# Patient Record
Sex: Female | Born: 1960 | Race: White | Hispanic: No | Smoking: Former smoker
Health system: Southern US, Community
[De-identification: ages and names within clinical notes are randomized; demographics above are authoritative.]

## PROBLEM LIST (undated history)

## (undated) DIAGNOSIS — Z202 Contact with and (suspected) exposure to infections with a predominantly sexual mode of transmission: Secondary | ICD-10-CM

## (undated) DIAGNOSIS — F329 Major depressive disorder, single episode, unspecified: Secondary | ICD-10-CM

## (undated) DIAGNOSIS — F32A Depression, unspecified: Secondary | ICD-10-CM

## (undated) DIAGNOSIS — F419 Anxiety disorder, unspecified: Secondary | ICD-10-CM

## (undated) HISTORY — DX: Major depressive disorder, single episode, unspecified: F32.9

## (undated) HISTORY — PX: TUBAL LIGATION: SHX77

## (undated) HISTORY — PX: APPENDECTOMY: SHX54

## (undated) HISTORY — DX: Anxiety disorder, unspecified: F41.9

## (undated) HISTORY — PX: TONSILLECTOMY: SUR1361

## (undated) HISTORY — DX: Contact with and (suspected) exposure to infections with a predominantly sexual mode of transmission: Z20.2

## (undated) HISTORY — DX: Depression, unspecified: F32.A

---

## 2012-05-27 ENCOUNTER — Ambulatory Visit: Payer: Self-pay | Admitting: Licensed Clinical Social Worker

## 2012-05-27 ENCOUNTER — Ambulatory Visit (INDEPENDENT_AMBULATORY_CARE_PROVIDER_SITE_OTHER): Payer: BC Managed Care – PPO | Admitting: Licensed Clinical Social Worker

## 2012-05-27 DIAGNOSIS — F331 Major depressive disorder, recurrent, moderate: Secondary | ICD-10-CM

## 2012-06-02 DIAGNOSIS — F329 Major depressive disorder, single episode, unspecified: Secondary | ICD-10-CM | POA: Insufficient documentation

## 2012-06-03 ENCOUNTER — Ambulatory Visit (INDEPENDENT_AMBULATORY_CARE_PROVIDER_SITE_OTHER): Payer: BC Managed Care – PPO | Admitting: Licensed Clinical Social Worker

## 2012-06-03 DIAGNOSIS — F331 Major depressive disorder, recurrent, moderate: Secondary | ICD-10-CM

## 2012-06-10 ENCOUNTER — Ambulatory Visit (INDEPENDENT_AMBULATORY_CARE_PROVIDER_SITE_OTHER): Payer: BC Managed Care – PPO | Admitting: Licensed Clinical Social Worker

## 2012-06-10 DIAGNOSIS — F331 Major depressive disorder, recurrent, moderate: Secondary | ICD-10-CM

## 2012-06-29 ENCOUNTER — Ambulatory Visit (INDEPENDENT_AMBULATORY_CARE_PROVIDER_SITE_OTHER): Payer: BC Managed Care – PPO | Admitting: Licensed Clinical Social Worker

## 2012-06-29 DIAGNOSIS — F331 Major depressive disorder, recurrent, moderate: Secondary | ICD-10-CM

## 2012-08-19 ENCOUNTER — Ambulatory Visit (INDEPENDENT_AMBULATORY_CARE_PROVIDER_SITE_OTHER): Payer: BC Managed Care – PPO | Admitting: Licensed Clinical Social Worker

## 2012-08-19 DIAGNOSIS — F331 Major depressive disorder, recurrent, moderate: Secondary | ICD-10-CM

## 2012-08-31 ENCOUNTER — Ambulatory Visit (INDEPENDENT_AMBULATORY_CARE_PROVIDER_SITE_OTHER): Payer: BC Managed Care – PPO | Admitting: Licensed Clinical Social Worker

## 2012-08-31 DIAGNOSIS — F331 Major depressive disorder, recurrent, moderate: Secondary | ICD-10-CM

## 2012-09-30 ENCOUNTER — Ambulatory Visit: Payer: BC Managed Care – PPO | Admitting: Licensed Clinical Social Worker

## 2012-10-07 ENCOUNTER — Ambulatory Visit (INDEPENDENT_AMBULATORY_CARE_PROVIDER_SITE_OTHER): Payer: BC Managed Care – PPO | Admitting: Licensed Clinical Social Worker

## 2012-10-07 DIAGNOSIS — F331 Major depressive disorder, recurrent, moderate: Secondary | ICD-10-CM

## 2012-12-21 ENCOUNTER — Ambulatory Visit: Payer: BC Managed Care – PPO | Admitting: Certified Nurse Midwife

## 2013-01-11 ENCOUNTER — Encounter: Payer: Self-pay | Admitting: Certified Nurse Midwife

## 2013-01-12 ENCOUNTER — Institutional Professional Consult (permissible substitution): Payer: Self-pay | Admitting: Certified Nurse Midwife

## 2013-01-19 ENCOUNTER — Ambulatory Visit (INDEPENDENT_AMBULATORY_CARE_PROVIDER_SITE_OTHER): Payer: BC Managed Care – PPO | Admitting: Certified Nurse Midwife

## 2013-01-19 ENCOUNTER — Encounter: Payer: Self-pay | Admitting: Certified Nurse Midwife

## 2013-01-19 VITALS — BP 120/70 | HR 68 | Resp 16 | Ht 61.25 in | Wt 137.0 lb

## 2013-01-19 DIAGNOSIS — F411 Generalized anxiety disorder: Secondary | ICD-10-CM

## 2013-01-19 MED ORDER — ESCITALOPRAM OXALATE 10 MG PO TABS: 10.0000 mg | ORAL_TABLET | Freq: Every day | ORAL | Status: DC

## 2013-01-19 NOTE — Progress Notes (Signed)
52 y.o. Divorced Caucasian female [phone] here for follow up of anxiety treated with Lexapro 10 mg initiated on 06/03/12.Taking medication in am, and working well.  Denies any crying episodes, insomnia, panic attacks, or thoughts of self harm or others.  Daughter recently married and doing well.  Mom and daughter talk now.  Patients mother now moved here from [place] for health issues. Patient denies any problems with assisting mother. 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 " a great change for me" sees counselor prn now. Periods regular until last 2 months, no menses, but had period the end of June. Has menses calendar to record and is aware of changes with perimenopausal. No health issues today!   O: Healthy WD,WN female Affect: Normal, smiling, well dressed    A:Anxiety/Depression stable on Lexapro 2-Episodic social stress with appropriate emotional response 3-Perimenopausal with menses cycle change   P: Continue medication daily in am. Will re-evaluate at aex in 11/14. Rx Lexapro see order Congratulated patient on the progress she has made 2-Continue to seek family union and support 3-Continue menses calendar with normal and abnormal parameters, and advise if change   RV prn, aex  35 minutes spent with patient with >50% of time spent in face to face counseling.

## 2013-01-19 NOTE — Progress Notes (Signed)
Reviewed CPRomine, MD 

## 2013-02-01 ENCOUNTER — Ambulatory Visit: Payer: Self-pay | Admitting: Certified Nurse Midwife

## 2013-05-31 ENCOUNTER — Ambulatory Visit: Payer: BC Managed Care – PPO | Admitting: Certified Nurse Midwife

## 2013-06-21 ENCOUNTER — Encounter: Payer: Self-pay | Admitting: Certified Nurse Midwife

## 2013-06-21 ENCOUNTER — Ambulatory Visit (INDEPENDENT_AMBULATORY_CARE_PROVIDER_SITE_OTHER): Payer: BC Managed Care – PPO | Admitting: Certified Nurse Midwife

## 2013-06-21 VITALS — BP 120/70 | HR 72 | Resp 16 | Ht 60.5 in | Wt 146.0 lb

## 2013-06-21 DIAGNOSIS — Z01419 Encounter for gynecological examination (general) (routine) without abnormal findings: Secondary | ICD-10-CM

## 2013-06-21 DIAGNOSIS — F341 Dysthymic disorder: Secondary | ICD-10-CM

## 2013-06-21 DIAGNOSIS — F411 Generalized anxiety disorder: Secondary | ICD-10-CM

## 2013-06-21 DIAGNOSIS — N951 Menopausal and female climacteric states: Secondary | ICD-10-CM

## 2013-06-21 DIAGNOSIS — Z0000 Encounter for general adult medical examination without abnormal findings: Secondary | ICD-10-CM

## 2013-06-21 DIAGNOSIS — F329 Major depressive disorder, single episode, unspecified: Secondary | ICD-10-CM

## 2013-06-21 LAB — FOLLICLE STIMULATING HORMONE: FSH: 52.9 m[IU]/mL

## 2013-06-21 MED ORDER — ESCITALOPRAM OXALATE 10 MG PO TABS: 10.0000 mg | ORAL_TABLET | Freq: Every day | ORAL | Status: DC

## 2013-06-21 NOTE — Patient Instructions (Signed)

## 2013-06-21 NOTE — Progress Notes (Signed)
52 y.o. [id number] Divorced Caucasian Fe here for annual exam.  Perimenopausal with amenorrhea, last period 02/05/13.  No bleeding or spotting until 11/14 which lasted 3 days, red blood. Occasional hot flash or night sweats. Denies vaginal dryness. Anxiety/Depression so much better with Lexapro, desires continuance. Sees urgent care if needed.  Patient's last menstrual period was 02/05/2013. Spotting for 3 days 11/14        Sexually active: no  The current method of family planning is tubal ligation.    Exercising: no  but lifts and walks alot with job Smoker:  No  Quit smoking in 2010 (cigarettes)  Health Maintenance: Pap:  05/24/12 neg MMG: 2007  Pt declines having another mammogram, financial reason, offered free information declines Colonoscopy:  Declines ever due to know history, IFOB dispensed BMD:   2007  Osteoporosis  Pt declines having another BMD no xray exposure desired.,  TDaP:  Less than 10 years Labs:  Pt. Declines  Hgb and U/A   reports that she quit smoking about 4 years ago. Her smoking use included Cigarettes. She smoked 0.00 packs per day. She has never used smokeless tobacco. She reports that she does not drink alcohol or use illicit drugs.  Past Medical History  Diagnosis Date  . Anxiety   . Depression   . Possible exposure to STD     sexually active with +HIV partner yrs ago, testing to 2004 neg    Past Surgical History  Procedure Laterality Date  . Tubal ligation    . Appendectomy    . Tonsillectomy      Current Outpatient Prescriptions  Medication Sig Dispense Refill  . escitalopram (LEXAPRO) 10 MG tablet Take 1 tablet (10 mg total) by mouth daily.  30 tablet  5   No current facility-administered medications for this visit.    Family History  Problem Relation Age of Onset  . Hypertension Father   . Hepatitis C Father   . Stroke Maternal Grandmother   . COPD Mother   . Osteoporosis Mother     ROS:  Pertinent items are noted in HPI.  Otherwise, a  comprehensive ROS was negative.  Exam:   BP 120/70  Pulse 72  Resp 16  Ht 5' 0.5" (1.537 m)  Wt 146 lb (66.225 kg)  BMI 28.03 kg/m2  LMP 02/05/2013 Height: 5' 0.5" (153.7 cm)  Ht Readings from Last 3 Encounters:  06/21/13 5' 0.5" (1.537 m)  01/19/13 5' 1.25" (1.556 m)    General appearance: alert, cooperative and appears stated age Head: Normocephalic, without obvious abnormality, atraumatic Neck: no adenopathy, supple, symmetrical, trachea midline and thyroid normal to inspection and palpation Lungs: clear to auscultation bilaterally Breasts: normal appearance, no masses or tenderness, No nipple retraction or dimpling, No nipple discharge or bleeding, No axillary or supraclavicular adenopathy Heart: regular rate and rhythm Abdomen: soft, non-tender; no masses,  no organomegaly Extremities: extremities normal, atraumatic, no cyanosis or edema Skin: Skin color, texture, turgor normal. No rashes or lesions Lymph nodes: Cervical, supraclavicular, and axillary nodes normal. No abnormal inguinal nodes palpated Neurologic: Grossly normal   Pelvic: External genitalia:  no lesions              Urethra:  normal appearing urethra with no masses, tenderness or lesions              Bartholin's and Skene's: normal                 Vagina: normal appearing vagina with normal color  and discharge, no lesions              Cervix: normal, non tender              Pap taken: no Bimanual Exam:  Uterus:  normal size, contour, position, consistency, mobility, non-tender and anteverted              Adnexa: normal adnexa and no mass, fullness, tenderness               Rectovaginal: Confirms               Anus:  normal sphincter tone, no lesions  A:  Well Woman with normal exam  Perimenopausal with irregular cycles  Anxiety/Depression doing well on Lexapro  P:   Reviewed health and wellness pertinent to exam  Discussed etiology of perimenopause and expectations and warning signs with bleeding  profile. Has menses calendar. Instructed to notify if no bleeding in 3 months. Lab [facility]  Discussed continuation of Lexapro and counseling as needed, patient agrees to this plan, because it works.  Rx Lexapro see order  Pap smear as per guidelines   Mammogram yearly recommended, discussed free program available, patient declines. Discussed early detection advantage with mammogram, patient declined ever. pap smear not taken today counseled on breast self exam, mammography screening, menopause, adequate intake of calcium and vitamin D, diet and exercise, and colonoscopy recommended at 52, patient declined scheduling, IFOB dispensed.  return annually or prn  An After Visit Summary was printed and given to the patient.

## 2013-06-22 ENCOUNTER — Telehealth: Payer: Self-pay

## 2013-06-22 MED ORDER — MEDROXYPROGESTERONE ACETATE 10 MG PO TABS: 10.0000 mg | ORAL_TABLET | Freq: Every day | ORAL | Status: DC

## 2013-06-22 NOTE — Telephone Encounter (Signed)
Patient notified of results.

## 2013-06-22 NOTE — Telephone Encounter (Signed)
Returning a call to Joy. °

## 2013-06-22 NOTE — Telephone Encounter (Signed)
lmtcb

## 2013-06-22 NOTE — Telephone Encounter (Signed)
Message copied by Eliezer Bottom on Wed Jun 22, 2013  1:56 PM ------      Message from: Verner Chol      Created: Wed Jun 22, 2013 12:10 PM       Notify patient that [facility] shows menopausal range, but may or may not have any more bleeding.      Needs Rx Provera due to no period since 6/14. Order in. Instruct patient to call if no bleeding after 2 weeks after completing Provera or if she has bleeding, so we can assess status at that point. ------

## 2013-06-24 NOTE — Progress Notes (Signed)
Note reviewed, agree with plan.  Vikram Tillett, MD  

## 2013-08-27 ENCOUNTER — Encounter (HOSPITAL_COMMUNITY): Payer: Self-pay | Admitting: Emergency Medicine

## 2013-08-27 ENCOUNTER — Observation Stay (HOSPITAL_COMMUNITY)
Admission: EM | Admit: 2013-08-27 | Discharge: 2013-08-28 | Disposition: A | Discharge status: Home / Self Care | Payer: BC Managed Care – PPO | Attending: Family Medicine | Admitting: Family Medicine

## 2013-08-27 ENCOUNTER — Emergency Department (HOSPITAL_COMMUNITY): Payer: BC Managed Care – PPO

## 2013-08-27 ENCOUNTER — Emergency Department (HOSPITAL_COMMUNITY)
Admission: EM | Admit: 2013-08-27 | Discharge: 2013-08-27 | Disposition: A | Discharge status: Hospice | Payer: BC Managed Care – PPO | Source: Home / Self Care | Attending: Family Medicine | Admitting: Family Medicine

## 2013-08-27 DIAGNOSIS — F341 Dysthymic disorder: Secondary | ICD-10-CM

## 2013-08-27 DIAGNOSIS — R079 Chest pain, unspecified: Secondary | ICD-10-CM

## 2013-08-27 DIAGNOSIS — R05 Cough: Secondary | ICD-10-CM | POA: Insufficient documentation

## 2013-08-27 DIAGNOSIS — R110 Nausea: Secondary | ICD-10-CM | POA: Insufficient documentation

## 2013-08-27 DIAGNOSIS — R6889 Other general symptoms and signs: Secondary | ICD-10-CM | POA: Insufficient documentation

## 2013-08-27 DIAGNOSIS — Z880 Allergy status to penicillin: Secondary | ICD-10-CM | POA: Insufficient documentation

## 2013-08-27 DIAGNOSIS — J029 Acute pharyngitis, unspecified: Secondary | ICD-10-CM | POA: Insufficient documentation

## 2013-08-27 DIAGNOSIS — F3289 Other specified depressive episodes: Secondary | ICD-10-CM | POA: Insufficient documentation

## 2013-08-27 DIAGNOSIS — R059 Cough, unspecified: Secondary | ICD-10-CM

## 2013-08-27 DIAGNOSIS — Z79899 Other long term (current) drug therapy: Secondary | ICD-10-CM | POA: Insufficient documentation

## 2013-08-27 DIAGNOSIS — R0789 Other chest pain: Principal | ICD-10-CM | POA: Insufficient documentation

## 2013-08-27 DIAGNOSIS — F419 Anxiety disorder, unspecified: Secondary | ICD-10-CM

## 2013-08-27 DIAGNOSIS — F329 Major depressive disorder, single episode, unspecified: Secondary | ICD-10-CM

## 2013-08-27 DIAGNOSIS — Z91040 Latex allergy status: Secondary | ICD-10-CM | POA: Insufficient documentation

## 2013-08-27 DIAGNOSIS — Z87891 Personal history of nicotine dependence: Secondary | ICD-10-CM | POA: Insufficient documentation

## 2013-08-27 DIAGNOSIS — F411 Generalized anxiety disorder: Secondary | ICD-10-CM | POA: Insufficient documentation

## 2013-08-27 DIAGNOSIS — R51 Headache: Secondary | ICD-10-CM | POA: Insufficient documentation

## 2013-08-27 LAB — BASIC METABOLIC PANEL
BUN: 14 mg/dL (ref 6–23)
CO2: 25 mEq/L (ref 19–32)
CREATININE: 0.76 mg/dL (ref 0.50–1.10)
Calcium: 8.3 mg/dL — ABNORMAL LOW (ref 8.4–10.5)
Chloride: 105 mEq/L (ref 96–112)
GFR calc Af Amer: >90 mL/min (ref >90)
Glucose, Bld: 90 mg/dL (ref 70–99)
POTASSIUM: 4.3 meq/L (ref 3.7–5.3)
Sodium: 142 mEq/L (ref 137–147)

## 2013-08-27 LAB — TROPONIN I

## 2013-08-27 LAB — POCT I-STAT TROPONIN I: Troponin i, poc: 0 ng/mL (ref 0.00–0.08)

## 2013-08-27 LAB — CBC
HEMATOCRIT: 42 % (ref 36.0–46.0)
Hemoglobin: 14.6 g/dL (ref 12.0–15.0)
MCH: 31.1 pg (ref 26.0–34.0)
MCHC: 34.8 g/dL (ref 30.0–36.0)
MCV: 89.6 fL (ref 78.0–100.0)
Platelets: 207 10*3/uL (ref 150–400)
RBC: 4.69 MIL/uL (ref 3.87–5.11)
RDW: 12.2 % (ref 11.5–15.5)
WBC: 4 10*3/uL (ref 4.0–10.5)

## 2013-08-27 IMAGING — CR DG CHEST 1V PORT
1 series · 1 of 1 positions shown · non-contrast
Comparison: None.

CLINICAL DATA: 52-year-old female with chest pain and cough.

EXAM:
PORTABLE CHEST - 1 VIEW

[AP]
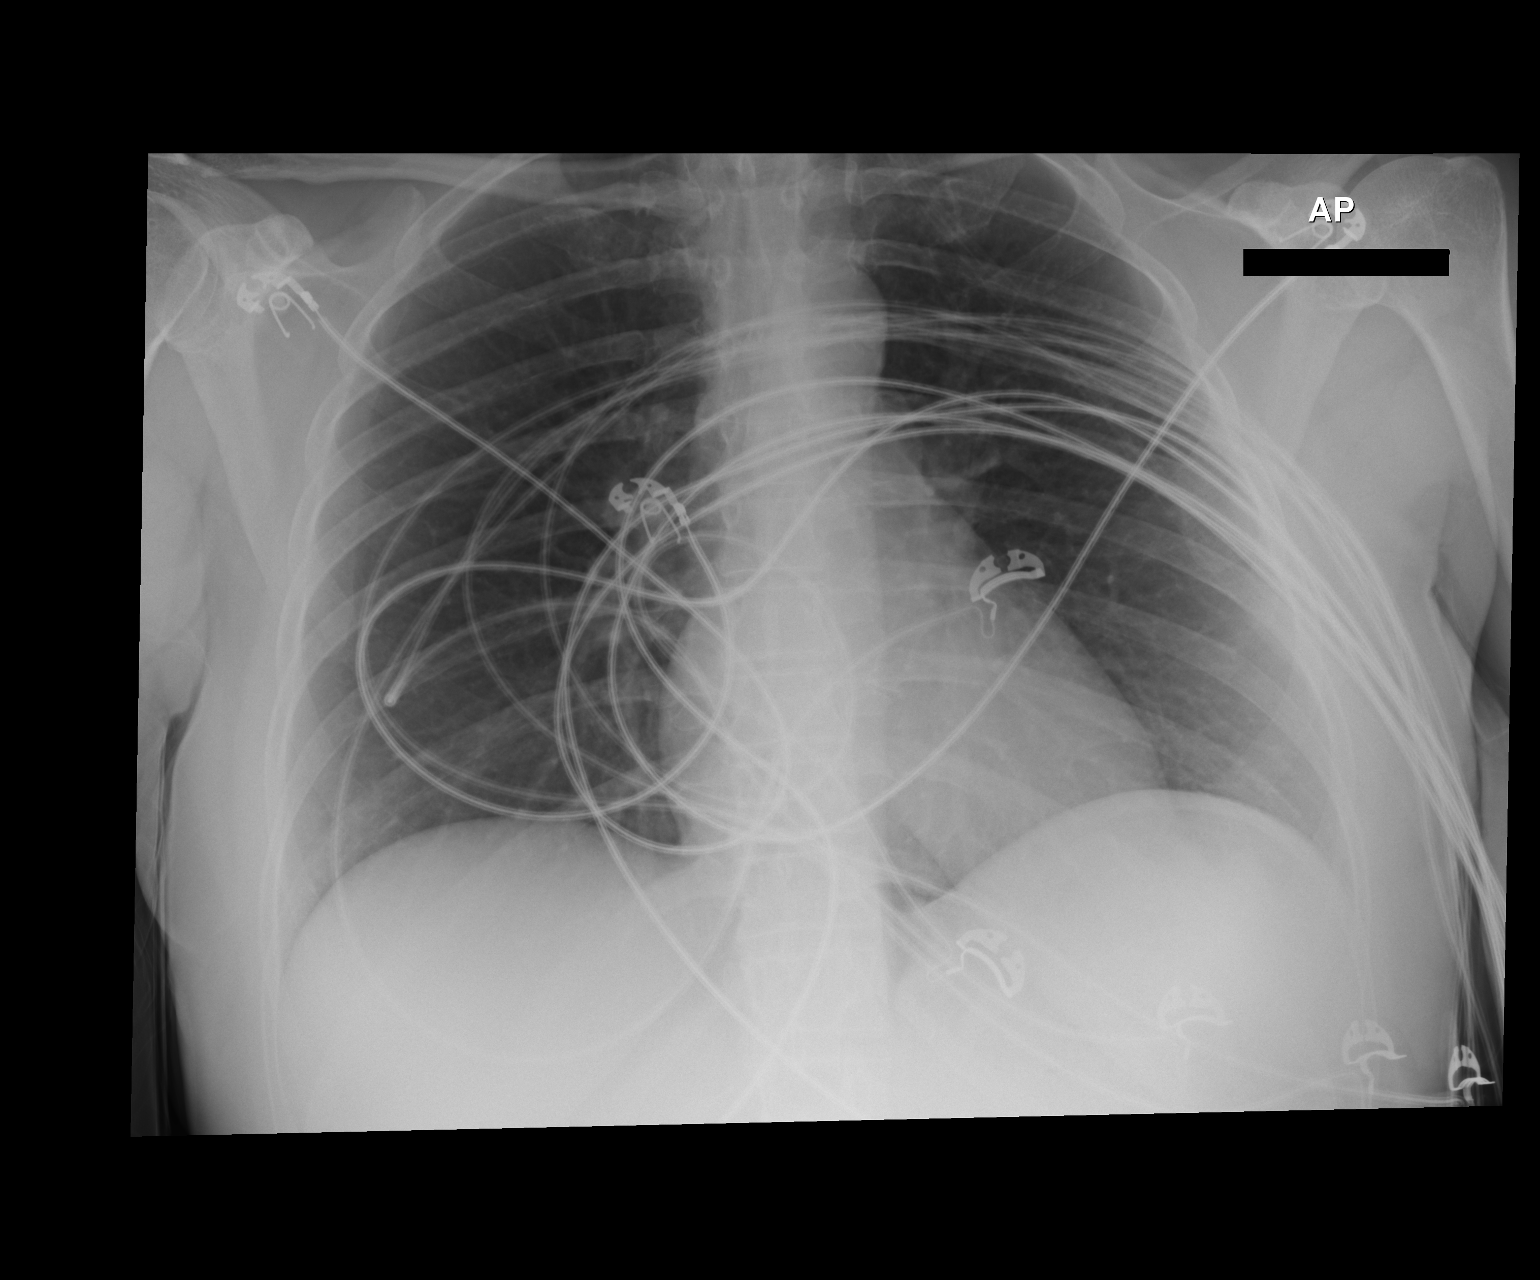

[1 of 1 positions shown; findings below may reference images not displayed]

FINDINGS: The cardiomediastinal silhouette is unremarkable.

The lungs are clear.

There is no evidence of focal airspace disease, pulmonary edema,
suspicious pulmonary nodule/mass, pleural effusion, or pneumothorax.
No acute bony abnormalities are identified.
IMPRESSION: No active disease.

## 2013-08-27 MED ORDER — MORPHINE SULFATE 4 MG/ML IJ SOLN
4.0000 mg | Freq: Once | INTRAMUSCULAR | Status: AC
  Administered 2013-08-27: 4 mg via INTRAVENOUS
  Filled 2013-08-27: qty 1

## 2013-08-27 MED ORDER — ASPIRIN 81 MG PO CHEW
324.0000 mg | CHEWABLE_TABLET | Freq: Once | ORAL | Status: AC
  Administered 2013-08-27: 324 mg via ORAL

## 2013-08-27 MED ORDER — SODIUM CHLORIDE 0.9 % IV SOLN
Freq: Once | INTRAVENOUS | Status: AC
  Administered 2013-08-27: 17:00:00 via INTRAVENOUS

## 2013-08-27 MED ORDER — ASPIRIN EC 81 MG PO TBEC
81.0000 mg | DELAYED_RELEASE_TABLET | Freq: Every day | ORAL | Status: DC
  Filled 2013-08-27: qty 1

## 2013-08-27 MED ORDER — ALPRAZOLAM 0.25 MG PO TABS: 0.2500 mg | ORAL_TABLET | Freq: Two times a day (BID) | ORAL | Status: DC | PRN

## 2013-08-27 MED ORDER — NITROGLYCERIN 0.4 MG SL SUBL
0.4000 mg | SUBLINGUAL_TABLET | SUBLINGUAL | Status: DC | PRN
  Administered 2013-08-27: 0.4 mg via SUBLINGUAL

## 2013-08-27 MED ORDER — ONDANSETRON HCL 4 MG/2ML IJ SOLN: 4.0000 mg | Freq: Four times a day (QID) | INTRAMUSCULAR | Status: DC | PRN

## 2013-08-27 MED ORDER — ASPIRIN 81 MG PO CHEW
CHEWABLE_TABLET | ORAL | Status: AC
  Filled 2013-08-27: qty 4

## 2013-08-27 MED ORDER — ACETAMINOPHEN 325 MG PO TABS
650.0000 mg | ORAL_TABLET | ORAL | Status: DC | PRN
Start: 2013-08-27 — End: 2013-08-28

## 2013-08-27 MED ORDER — ESCITALOPRAM OXALATE 10 MG PO TABS
10.0000 mg | ORAL_TABLET | Freq: Every day | ORAL | Status: DC
  Administered 2013-08-27: 10 mg via ORAL
  Filled 2013-08-27 (×2): qty 1

## 2013-08-27 MED ORDER — NITROGLYCERIN 0.4 MG SL SUBL: 0.4000 mg | SUBLINGUAL_TABLET | SUBLINGUAL | Status: DC | PRN

## 2013-08-27 MED ORDER — HEPARIN SODIUM (PORCINE) 5000 UNIT/ML IJ SOLN
5000.0000 [IU] | Freq: Three times a day (TID) | INTRAMUSCULAR | Status: DC
  Administered 2013-08-27 – 2013-08-28 (×2): 5000 [IU] via SUBCUTANEOUS
  Filled 2013-08-27 (×5): qty 1

## 2013-08-27 MED ORDER — MORPHINE SULFATE 2 MG/ML IJ SOLN: 2.0000 mg | INTRAMUSCULAR | Status: DC | PRN

## 2013-08-27 MED ORDER — NITROGLYCERIN 0.4 MG SL SUBL
SUBLINGUAL_TABLET | SUBLINGUAL | Status: AC
  Filled 2013-08-27: qty 25

## 2013-08-27 MED ORDER — SODIUM CHLORIDE 0.9 % IV SOLN
INTRAVENOUS | Status: AC
  Administered 2013-08-27: 22:00:00 via INTRAVENOUS

## 2013-08-27 NOTE — ED Notes (Signed)
Pain has eased.

## 2013-08-27 NOTE — ED Notes (Signed)
C/O COLD SX STATES SHE HAS A DRY COUGH WHICH IS MAKING HER THROAT SORE AND SNEEZING MOTRIN AND VICKS VAPOR RUB WAS USED BUT NO RELIEF.

## 2013-08-27 NOTE — ED Notes (Signed)
States is having severe abd pain in right upper quad and epigastric area-- pain on palpation--states it started after receiving Morphine. Pain into back.

## 2013-08-27 NOTE — ED Notes (Signed)
Pt able to ambulate to the restroom

## 2013-08-27 NOTE — ED Provider Notes (Signed)
CSN: [id number]     Arrival date & time 08/27/13  1455 History   First MD Initiated Contact with Patient 08/27/13 1624     Chief Complaint  Patient presents with  . URI   (Consider location/radiation/quality/duration/timing/severity/associated sxs/prior Treatment) HPI Comments: 53 year old female with no significant past medical history presents for evaluation of chest pain and side pain. 3 days ago, she began to experience left upper chest and left lateral rib cage pain that radiated to her left arm. This was associated with nausea. The pain has been waxing and waning in severity but it is still present at this time. The nausea has since resolved. Yesterday, she began to get a persistent dry cough that has been associated with a sore throat.  Patient is a 53 y.o. female presenting with URI.  URI Presenting symptoms: cough and sore throat   Presenting symptoms: no fever   Associated symptoms: sneezing   Associated symptoms: no arthralgias and no myalgias     Past Medical History  Diagnosis Date  . Anxiety   . Depression   . 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 Tubal ligation    . Appendectomy    . Tonsillectomy     Family History  Problem Relation Age of Onset  . Hypertension Father   . Hepatitis C Father   . Stroke Maternal Grandmother   . COPD Mother   . Osteoporosis Mother    History  Substance Use Topics  . Smoking status: Former Smoker    Types: Cigarettes    Quit date: 06/21/2009  . Smokeless tobacco: Never Used  . Alcohol Use: No   OB History   Grav Para Term Preterm Abortions TAB SAB Ect Mult Living   4 3 3  1 1    3      Review of Systems  Constitutional: Negative for fever and chills.  HENT: Positive for sneezing and sore throat.   Eyes: Negative for visual disturbance.  Respiratory: Positive for cough. Negative for shortness of breath.    Cardiovascular: Positive for chest pain. Negative for palpitations and leg swelling.  Gastrointestinal: Positive for nausea. Negative for vomiting and abdominal pain.  Endocrine: Negative for polydipsia and polyuria.  Genitourinary: Negative for dysuria, urgency and frequency.  Musculoskeletal: Negative for arthralgias and myalgias.  Skin: Negative for rash.  Neurological: Negative for dizziness, weakness and light-headedness.    Allergies  Penicillins and Latex  Home Medications   Current Outpatient Rx  Name  Route  Sig  Dispense  Refill  . escitalopram (LEXAPRO) 10 MG tablet   Oral   Take 1 tablet (10 mg total) by mouth daily.   30 tablet   12   . medroxyPROGESTERone (PROVERA) 10 MG tablet   Oral   Take 1 tablet (10 mg total) by mouth daily.   10 tablet   0    BP 134/87  Pulse 90  Temp(Src) 98.6 F (37 C) (Oral)  Resp 19  SpO2 100% Physical Exam  Nursing note and vitals reviewed. Constitutional: She is oriented to person, place, and time. Vital signs are normal. She appears well-developed and well-nourished. No distress.  HENT:  Head: Normocephalic and atraumatic.  Cardiovascular: Normal rate, regular rhythm and normal heart sounds.  Exam reveals no gallop and no friction rub.   No murmur heard. Pulmonary/Chest: Effort normal and breath sounds normal. No  respiratory distress. She has no wheezes. She has no rales. She exhibits no tenderness.  Neurological: She is alert and oriented to person, place, and time. She has normal strength. Coordination normal.  Skin: Skin is warm and dry. No rash noted. She is not diaphoretic.  Psychiatric: She has a normal mood and affect. Judgment normal.    ED Course  Procedures (including critical care time) Labs Review Labs Reviewed - No data to display Imaging Review No results found.  EKG ?right axis deviation.  Otherwise normal   MDM   1. Chest pain   2. Cough    This patient is having chest pain that has been  radiating to her left arm, associated with nausea. She believes this is caused by muscular pain, but her chest is completely nontender. She also thinks it has been caused by coughing, although she was having the chest pain for 2 days before the coughing started.  She is being tranferred to ED to r/o ACS, PE.      Graylon GoodZachary H Yisel Megill, PA-C 08/27/13 1656

## 2013-08-27 NOTE — ED Provider Notes (Signed)
Medical screening examination/treatment/procedure(s) were conducted as a shared visit with non-physician practitioner(s) and myself.  I personally evaluated the patient during the encounter.   CP in pt with cardiac risk factors. Plan to admit to medicine for further evaluation. No issues during ER stay.  Darlys Galesavid Masneri, MD 08/27/13 [phone]

## 2013-08-27 NOTE — ED Provider Notes (Signed)
Medical screening examination/treatment/procedure(s) were performed by a resident physician or non-physician practitioner and as the supervising physician I was immediately available for consultation/collaboration.  Hiawatha Merriott, MD    Kileen Lange S Moises Terpstra, MD 08/27/13 2026 

## 2013-08-27 NOTE — H&P (Signed)
Patient Demographics  Rebekah Hale, is a 53 y.o. female  MRN: [id number]   DOB - 11-03-1960  Admit Date - 08/27/2013  Outpatient Primary MD for the patient is No PCP Per Patient   With History of -  Past Medical History  Diagnosis Date  . Anxiety   . Depression   . Possible exposure to STD     sexually active with +HIV partner yrs ago, testing to 2004 neg      Past Surgical History  Procedure Laterality Date  . Tubal ligation    . Appendectomy    . Tonsillectomy      in for   Chief Complaint  Patient presents with  . Chest Pain  . Nausea  . Cough     HPI  Rebekah Hale  is a 53 y.o. female, without significant cardiac history, presents with complaints of chest pain for last 3 days, described it as intermittent, provoked by exercise, resolved by sublingual nitroglycerin, accompanied by some nausea, denies any dizziness, diaphoresis, palpitation, patient went to urgent care today for same complaints, was given 324 mg of by mouth aspirin, and sublingual nitroglycerin, reports her chest pain has resolved after receiving sublingual nitroglycerin , patient troponin was negative, her EKG had no acute ST changes.    Review of Systems    In addition to the HPI above,  No Fever-chills, No Headache, No changes with Vision or hearing, No problems swallowing food or Liquids, Complaints of Chest pain, and Cough denies any Shortness of Breath, No Abdominal pain, No Nausea or Vommitting, Bowel movements are regular, No Blood in stool or Urine, No dysuria, No new skin rashes or bruises, No new joints pains-aches,  No new weakness, tingling, numbness in any extremity, No recent weight gain or loss, No polyuria, polydypsia or polyphagia, No significant Mental Stressors.  A full 10 point Review of Systems  was done, except as stated above, all other Review of Systems were negative.   Social History History  Substance Use Topics  . Smoking status: Former Smoker    Types: Cigarettes    Quit date: 06/21/2009  . Smokeless tobacco: Never Used  . Alcohol Use: No     Family History Family History  Problem Relation Age of Onset  . Hypertension Father   . Hepatitis C Father   . Stroke Maternal Grandmother   . COPD Mother   . Osteoporosis Mother     Prior to Admission medications   Medication Sig Start Date End Date Taking? Authorizing Provider  escitalopram (LEXAPRO) 10 MG tablet Take 1 tablet (10 mg total) by mouth daily. 06/21/13  Yes Verner Chol, CNM    Allergies  Allergen Reactions  . Penicillins Shortness Of Breath  . Latex Rash    Physical Exam  Vitals  Blood pressure 129/66, pulse 77, temperature 98.3 F (36.8 C), temperature source Oral, resp. rate 18, height 5\' 1"  (1.549 m), weight 66.225 kg (146 lb), SpO2  99.00%.   1. General well-nourished female lying in bed in NAD,    2. Normal affect and insight, Not Suicidal or Homicidal, Awake Alert, Oriented X 3.  3. No F.N deficits, ALL C.Nerves Intact, Strength 5/5 all 4 extremities, Sensation intact all 4 extremities, Plantars down going.  4. Ears and Eyes appear Normal, Conjunctivae clear, PERRLA. Moist Oral Mucosa.  5. Supple Neck, No JVD, No cervical lymphadenopathy appriciated, No Carotid Bruits.  6. Symmetrical Chest wall movement, Good air movement bilaterally, CTAB. No reproducible chest pain on palpation.  7. RRR, No Gallops, Rubs or Murmurs, No Parasternal Heave.  8. Positive Bowel Sounds, Abdomen Soft, Non tender, No organomegaly appriciated,No rebound -guarding or rigidity.  9.  No Cyanosis, Normal Skin Turgor, No Skin Rash or Bruise.  10. Good muscle tone,  joints appear normal , no effusions, Normal ROM.  11. No Palpable Lymph Nodes in Neck or Axillae    Data Review   CBC  Recent  Labs Lab 08/27/13 1748  WBC 4.0  HGB 14.6  HCT 42.0  PLT 207  MCV 89.6  MCH 31.1  MCHC 34.8  RDW 12.2   ------------------------------------------------------------------------------------------------------------------  Chemistries   Recent Labs Lab 08/27/13 1748  NA 142  K 4.3  CL 105  CO2 25  GLUCOSE 90  BUN 14  CREATININE 0.76  CALCIUM 8.3*   ------------------------------------------------------------------------------------------------------------------ estimated creatinine clearance is 73.3 ml/min (by C-G formula based on Cr of 0.76). ------------------------------------------------------------------------------------------------------------------ No results found for this basename: TSH, T4TOTAL, FREET3, T3FREE, THYROIDAB,  in the last 72 hours   Coagulation profile No results found for this basename: INR, PROTIME,  in the last 168 hours ------------------------------------------------------------------------------------------------------------------- No results found for this basename: DDIMER,  in the last 72 hours -------------------------------------------------------------------------------------------------------------------  Cardiac Enzymes  Recent Labs Lab 08/27/13 1848  TROPONINI <0.30   ------------------------------------------------------------------------------------------------------------------ No components found with this basename: POCBNP,    ---------------------------------------------------------------------------------------------------------------  Urinalysis No results found for this basename: colorurine, appearanceur, labspec, phurine, glucoseu, hgbur, bilirubinur, ketonesur, proteinur, urobilinogen, nitrite, leukocytesur    ----------------------------------------------------------------------------------------------------------------  Imaging results:   Dg Chest Port 1 View  08/27/2013   CLINICAL DATA:  53 year old female  with chest pain and cough.  EXAM: PORTABLE CHEST - 1 VIEW  COMPARISON:  None.  FINDINGS: The cardiomediastinal silhouette is unremarkable.  The lungs are clear.  There is no evidence of focal airspace disease, pulmonary edema, suspicious pulmonary nodule/mass, pleural effusion, or pneumothorax. No acute bony abnormalities are identified.  IMPRESSION: No active disease.   Electronically Signed   By: Laveda Abbe M.D.   On: 08/27/2013 18:07    My personal review of EKG: Rhythm NSR, Rate  70 /min, no Acute ST changes    Assessment & Plan  Active Problems:   Chest pain   Anxiety and depression    1. chest pain: Patient will be admitted to telemetry unit, already received the 324 mg oral aspirin, will continue to cycle her cardiac enzymes, and if they are negative, she will have me review stress test done in the morning, stress test was approved by Dr. Zachery Conch. Meanwhile we'll continue her on baby aspirin daily.  2. Anxiety and depression: Continue with Lexapro   DVT Prophylaxis Heparin  AM Labs Ordered, also please review Full Orders  Family Communication: Admission, patients condition and plan of care including tests being ordered have been discussed with the patient  who indicate understanding and agree with the plan and Code Status.  Code Status Full  Likely DC to  home  Condition GUARDED    Time spent in minutes : 45 min    Skylie Hiott M.D on 08/27/2013 at 8:08 PM   And look for the night coverage person covering me after hours  [facility] Office  [phone]

## 2013-08-27 NOTE — ED Notes (Signed)
Onset 3 days ago chest pain and nausea with dry cough 2 days ago.  Seen at Urgent Care today sent to ED for further evaluation.  Prior to arrival given 324 mg aspirin and one nitro SL. Pain 6/10 sharp improved to 0/10 with medication and currently 4/10 sharp. Denies shortness of breath.

## 2013-08-27 NOTE — ED Provider Notes (Signed)
CSN: [id number]     Arrival date & time 08/27/13  1733 History   First MD Initiated Contact with Patient 08/27/13 1749     Chief Complaint  Patient presents with  . Chest Pain  . Nausea  . Cough   (Consider location/radiation/quality/duration/timing/severity/associated sxs/prior Treatment) HPI Comments: Patient is a 53 year old female past medical history significant for anxiety, depression, former tobacco user presenting to the emergency department for 3 days of left-sided chest pain. Patient describes it as waxing and waning sharp shooting pain without radiation. Patient states prior to the onset of pain she was doing some heavy lifting at work, states this felt similar to other muscle strains and did not think much of it. Patient states a day later she developed a nonproductive cough, sore throat, sneezing, nausea. Patient denies any alleviating or aggravating factors prior to urgent care visit. States she received 324 mg of aspirin and 1 someway wall nitroglycerin which alleviated her chest pain, but is back up to 4/10. Patient states she did develop a generalized headache and currently still house dust. She denies any shortness of breath. Patient denies any cardiac history. No previous stress test, echocardiogram, catheterizations. No immediate familial cardiac history.   Past Medical History  Diagnosis Date  . Anxiety   . Depression   . 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 Tubal ligation    . Appendectomy    . Tonsillectomy     Family History  Problem Relation Age of Onset  . Hypertension Father   . Hepatitis C Father   . Stroke Maternal Grandmother   . COPD Mother   . Osteoporosis Mother    History  Substance Use Topics  . Smoking status: Former Smoker    Types: Cigarettes    Quit date: 06/21/2009  . Smokeless tobacco: Never Used  . Alcohol Use: No   OB History    Grav Para Term Preterm Abortions TAB SAB Ect Mult Living   4 3 3  1 1    3      Review of Systems  Constitutional: Negative for fever and chills.  HENT: Positive for sneezing and sore throat.   Respiratory: Positive for cough. Negative for shortness of breath.   Cardiovascular: Positive for chest pain. Negative for palpitations and leg swelling.  Gastrointestinal: Positive for nausea. Negative for vomiting, abdominal pain and diarrhea.  Neurological: Positive for headaches.  All other systems reviewed and are negative.    Allergies  Penicillins and Latex  Home Medications   Current Outpatient Rx  Name  Route  Sig  Dispense  Refill  . escitalopram (LEXAPRO) 10 MG tablet   Oral   Take 1 tablet (10 mg total) by mouth daily.   30 tablet   12    BP 129/66  Pulse 77  Temp(Src) 98.3 F (36.8 C) (Oral)  Resp 18  Ht 5\' 1"  (1.549 m)  Wt 146 lb (66.225 kg)  BMI 27.60 kg/m2  SpO2 99% Physical Exam  Constitutional: She is oriented to person, place, and time. She appears well-developed and well-nourished. No distress.  HENT:  Head: Normocephalic and atraumatic.  Right Ear: External ear normal.  Left Ear: External ear normal.  Nose: Nose normal.  Mouth/Throat: Oropharynx is clear and moist. No oropharyngeal exudate.  Eyes: Conjunctivae are normal.  Neck: Neck supple.  Cardiovascular: Normal rate, regular rhythm and normal  heart sounds.   Pulmonary/Chest: Effort normal and breath sounds normal. No respiratory distress. She has no wheezes. She has no rales. She exhibits no tenderness.  Abdominal: Soft. Bowel sounds are normal. There is no tenderness.  Musculoskeletal: Normal range of motion. She exhibits no edema.  Lymphadenopathy:    She has no cervical adenopathy.  Neurological: She is alert and oriented to person, place, and time.  Skin: Skin is warm and dry. She is not diaphoretic.    ED Course  Procedures (including critical care time) Medications  morphine 4 MG/ML  injection 4 mg (4 mg Intravenous Given 08/27/13 1852)    Labs Review Labs Reviewed  BASIC METABOLIC PANEL - Abnormal; Notable for the following:    Calcium 8.3 (*)    All other components within normal limits  CBC  TROPONIN I  POCT I-STAT TROPONIN I   Imaging Review Dg Chest Port 1 View  08/27/2013   CLINICAL DATA:  53 year old female with chest pain and cough.  EXAM: PORTABLE CHEST - 1 VIEW  COMPARISON:  None.  FINDINGS: The cardiomediastinal silhouette is unremarkable.  The lungs are clear.  There is no evidence of focal airspace disease, pulmonary edema, suspicious pulmonary nodule/mass, pleural effusion, or pneumothorax. No acute bony abnormalities are identified.  IMPRESSION: No active disease.   Electronically Signed   By: Laveda Abbe M.D.   On: 08/27/2013 18:07    EKG Interpretation    Date/Time:  Saturday August 27 2013 17:36:09 EST Ventricular Rate:  70 PR Interval:  133 QRS Duration: 84 QT Interval:  408 QTC Calculation: 440 R Axis:   84 Text Interpretation:  Sinus rhythm Baseline wander in lead(s) V1 Confirmed by MASNERI  MD, DAVID ([id number]) on 08/27/2013 6:06:51 PM            MDM   1. Chest pain     Filed Vitals:   08/27/13 1830  BP: 129/66  Pulse: 77  Temp:   Resp: 18    Afebrile, NAD, non-toxic appearing, AAOx4.   Concern for cardiac etiology of Chest Pain. Hospitalist has been consulted and will see patient in the ED for likely admit. Pt does not meet criteria for CP protocol and a further evaluation is recommended. Pt has been re-evaluated prior to consult and VSS, NAD, heart RRR, pain 0/10, lungs CTAB. No acute abnormalities found on EKG and first round of cardiac enzymes negative. This case was discussed with Dr. Redgie Grayer who has seen the patient and agrees with plan to admit.      Jeannetta Ellis, PA-C 08/27/13 2031

## 2013-08-28 ENCOUNTER — Observation Stay (HOSPITAL_COMMUNITY): Payer: BC Managed Care – PPO

## 2013-08-28 DIAGNOSIS — R079 Chest pain, unspecified: Secondary | ICD-10-CM

## 2013-08-28 LAB — TROPONIN I: Troponin I: <0.3 ng/mL (ref <0.30)

## 2013-08-28 MED ORDER — TECHNETIUM TC 99M SESTAMIBI - CARDIOLITE: 30.0000 | Freq: Once | INTRAVENOUS | Status: DC | PRN

## 2013-08-28 MED ORDER — TECHNETIUM TC 99M SESTAMIBI GENERIC - CARDIOLITE
10.0000 | Freq: Once | INTRAVENOUS | Status: AC | PRN
  Administered 2013-08-28: 10 via INTRAVENOUS

## 2013-08-28 NOTE — Progress Notes (Signed)
GXT Myoview done- pt exercised 9 minutes of the Bruce Protocol. Max HR 147, > 85% APMHR. no ST changes. Some lt lateral chest pain, atypical for angina. Images pending.  Corine ShelterLUKE Keamber Macfadden PA-C 08/28/2013 10:15 AM

## 2013-08-28 NOTE — H&P (Signed)
Physician Discharge Summary  Rebekah Hale EAV:[id number] DOB: 1961-06-18 DOA: 08/27/2013  PCP: No PCP Per Patient  Admit date: 08/27/2013 Discharge date: 08/28/2013  Time spent: > 35 minutes  Recommendations for Outpatient Follow-up:  1. These be sure to followup with physician should any new concerns arise.  Discharge Diagnoses:  Active Problems:   Anxiety and depression   Chest pain   Discharge Condition: Stable  Diet recommendation: Heart healthy  Filed Weights   08/27/13 1739 08/27/13 2044  Weight: 66.225 kg (146 lb) 65.817 kg (145 lb 1.6 oz)    History of present illness/Hospital Course: 53 year old Caucasian female with history of depression and anxiety who presented to the ED secondary to chest discomfort and upper respiratory infection.  Chest pain - And context 53 year old with upper respiratory infection and cough. - Cardiology on board initially and patient underwent workup. Troponins have been negative and Myoview stress test negative - Atypical most likely related to recent URI/cough  Upper respiratory infection - Most likely secondary to viral etiology. -Recommend supportive therapy with good hand hygiene  Procedures:  Myoview stress test  Consultations:  Cardiology: Dr. Eden Emms  Discharge Exam: Filed Vitals:   08/28/13 1400  BP: 147/70  Pulse: 71  Temp: 98.7 F (37.1 C)  Resp: 18    General: Pt in NAd, alert and awake Cardiovascular: RRR, no MRG Respiratory: CTA BL, no wheezes or rhales  Discharge Instructions  Discharge Orders   Future Appointments Provider Department Dept Phone   12/21/2013 11:15 AM Verner Chol, CNM [facility] [phone]   Future Orders Complete By Expires   Call MD for:  difficulty breathing, headache or visual disturbances  As directed    Call MD for:  severe uncontrolled pain  As directed    Diet - low sodium heart healthy  As directed    Discharge instructions  As directed    Comments:      Follow up with Primary Care Provider within the next two weeks.   Increase activity slowly  As directed        Medication List         escitalopram 10 MG tablet  Commonly known as:  LEXAPRO  Take 1 tablet (10 mg total) by mouth daily.       Allergies  Allergen Reactions  . Penicillins Shortness Of Breath  . Latex Rash      The results of significant diagnostics from this hospitalization (including imaging, microbiology, ancillary and laboratory) are listed below for reference.    Significant Diagnostic Studies: Nm Myocar Multi W/spect W/wall Motion / Ef  08/28/2013   CLINICAL DATA:  Chest pain.  EXAM: MYOCARDIAL IMAGING WITH SPECT (REST AND EXERCISE)  GATED LEFT VENTRICULAR WALL MOTION STUDY  LEFT VENTRICULAR EJECTION FRACTION  TECHNIQUE: Standard myocardial SPECT imaging was performed after resting intravenous injection of 10 mCi Tc-47m sestamibi. Subsequently, exercise tolerance test was performed by the patient under the supervision of the Cardiology staff. At peak-stress, 30 mCi Tc-66m sestamibi was injected intravenously and standard myocardial SPECT imaging was performed. Quantitative gated imaging was also performed to evaluate left ventricular wall motion, and estimate left ventricular ejection fraction.  COMPARISON:  Plain film 08/27/2013.  FINDINGS: Rest images demonstrate normal left ventricular cavity size. No fixed defect. Stress images demonstrate an area of subtle reversibility involving the apical segment of the anterior wall.  Evaluation of wall motion demonstrates normal left ventricular wall motion and thickening.  Ejection fraction is estimated at 77%. End diastolic volume  of 50 cc. End systolic volume of 12 cc.  IMPRESSION: 1. Subtle area of reversibility involving the apical segment of the anterior wall. Cannot exclude inducible ischemia. 2. Normal left ventricular wall motion and thickening. 3. Normal ejection fraction estimated at 77%.   Electronically Signed   By:  Jeronimo GreavesKyle  Talbot M.D.   On: 08/28/2013 12:15   Dg Chest Port 1 View  08/27/2013   CLINICAL DATA:  53 year old female with chest pain and cough.  EXAM: PORTABLE CHEST - 1 VIEW  COMPARISON:  None.  FINDINGS: The cardiomediastinal silhouette is unremarkable.  The lungs are clear.  There is no evidence of focal airspace disease, pulmonary edema, suspicious pulmonary nodule/mass, pleural effusion, or pneumothorax. No acute bony abnormalities are identified.  IMPRESSION: No active disease.   Electronically Signed   By: Laveda AbbeJeff  Hu M.D.   On: 08/27/2013 18:07    Microbiology: No results found for this or any previous visit (from the past 240 hour(s)).   Labs: Basic Metabolic Panel:  Recent Labs Lab 08/27/13 1748  NA 142  K 4.3  CL 105  CO2 25  GLUCOSE 90  BUN 14  CREATININE 0.76  CALCIUM 8.3*   Liver Function Tests: No results found for this basename: AST, ALT, ALKPHOS, BILITOT, PROT, ALBUMIN,  in the last 168 hours No results found for this basename: LIPASE, AMYLASE,  in the last 168 hours No results found for this basename: AMMONIA,  in the last 168 hours CBC:  Recent Labs Lab 08/27/13 1748  WBC 4.0  HGB 14.6  HCT 42.0  MCV 89.6  PLT 207   Cardiac Enzymes:  Recent Labs Lab 08/27/13 1848 08/28/13 0050  TROPONINI <0.30 <0.30   BNP: BNP (last 3 results) No results found for this basename: PROBNP,  in the last 8760 hours CBG: No results found for this basename: GLUCAP,  in the last 168 hours     Signed:  Penny PiaVEGA, Rebekah Hale  [facility] 08/28/2013, 3:19 PM

## 2013-08-28 NOTE — Progress Notes (Signed)
Patient ID: Bosie HelperDonna Stancil, female   DOB: [birth year], 53 y.o.   MRN: [id number] Reviewed patients stress myovue ECG normal no ishcemia Read by radiologist as possible small area apical ischemia Looks normal to me EF normal   Ok to d/c  I will send note to my nurse to f/u with me in a few weeks to see how she is doing   Charlton HawsPeter Cardarius Senat

## 2013-08-28 NOTE — Progress Notes (Signed)
Utilization Review Completed.Rebekah Hale T2/02/2014  

## 2013-08-29 NOTE — Progress Notes (Signed)
lmtcb ./cy 

## 2013-08-29 NOTE — Discharge Summary (Signed)
Rebekah Piarlando [name], MD Physician Signed Family Medicine H&P Service date: 08/28/2013 3:19 PM  Physician Discharge Summary    Rebekah Hale UJW:[id number] DOB: [birth year] DOA: 08/27/2013   PCP: No PCP Per Patient   Admit date: 08/27/2013 Discharge date: 08/28/2013   Time spent: > 35 minutes   Recommendations for Outpatient Follow-up:   These be sure to followup with physician should any new concerns arise.   Discharge Diagnoses:   Active Problems:   Anxiety and depression   Chest pain   Discharge Condition: Stable   Diet recommendation: Heart healthy    Filed Weights     08/27/13 1739  08/27/13 2044   Weight:  66.225 kg (146 lb)  65.817 kg (145 lb 1.6 oz)      History of present illness/Hospital Course: 53 year old Caucasian female with history of depression and anxiety who presented to the ED secondary to chest discomfort and upper respiratory infection.   Chest pain - And context 53 year old with upper respiratory infection and cough. - Cardiology on board initially and patient underwent workup. Troponins have been negative and Myoview stress test negative - Atypical most likely related to recent URI/cough   Upper respiratory infection - Most likely secondary to viral etiology. -Recommend supportive therapy with good hand hygiene   Procedures: Myoview stress test   Consultations: Cardiology: Dr. Eden EmmsNishan   Discharge Exam: Filed Vitals:     08/28/13 1400   BP:  147/70   Pulse:  71   Temp:  98.7 F (37.1 C)   Resp:  18      General: Pt in NAd, alert and awake Cardiovascular: RRR, no MRG Respiratory: CTA BL, no wheezes or rhales   Discharge Instructions    Discharge Orders     Future Appointments  Provider  Department  Dept Phone     12/21/2013 11:15 AM  Verner [facility]oleborah S Leonard[facility] CN[facility]  [phone]     Future Orders  Complete By  Expires     Call MD for:  difficulty breathing, headache or visual disturbances   As directed        Call MD for:  severe uncontrolled pain   As directed       Diet - low sodium heart healthy   As directed       Discharge instructions   As directed       Comments:       Follow up with Primary Care Provider within the next two weeks.     Increase activity slowly   As directed            Medication List              escitalopram 10 MG tablet   Commonly known as:  LEXAPRO   Take 1 tablet (10 mg total) by mouth daily.           Allergies   Allergen  Reactions   .  Penicillins  Shortness Of Breath   .  Latex  Rash       --------------------------------------------------------------------------------   The results of significant diagnostics from this hospitalization (including imaging, microbiology, ancillary and laboratory) are listed below for reference.       Significant Diagnostic Studies: Nm Myocar Multi W/spect W/wall Motion / Ef   08/28/2013   CLINICAL DATA:  Chest pain.  EXAM: MYOCARDIAL IMAGING WITH SPECT (REST AND EXERCISE)  GATED LEFT VENTRICULAR WALL MOTION STUDY  LEFT VENTRICULAR EJECTION FRACTION  TECHNIQUE: Standard myocardial SPECT imaging  was performed after resting intravenous injection of 10 mCi Tc-51m sestamibi. Subsequently, exercise tolerance test was performed by the patient under the supervision of the Cardiology staff. At peak-stress, 30 mCi Tc-86m sestamibi was injected intravenously and standard myocardial SPECT imaging was performed. Quantitative gated imaging was also performed to evaluate left ventricular wall motion, and estimate left ventricular ejection fraction.  COMPARISON:  Plain film 08/27/2013.  FINDINGS: Rest images demonstrate normal left ventricular cavity size. No fixed defect. Stress images demonstrate an area of subtle reversibility involving the apical segment of the anterior wall.  Evaluation of wall motion demonstrates normal left ventricular wall motion and thickening.  Ejection fraction is estimated at 77%. End diastolic volume of 50 cc.  End systolic volume of 12 cc.  IMPRESSION: 1. Subtle area of reversibility involving the apical segment of the anterior wall. Cannot exclude inducible ischemia. 2. Normal left ventricular wall motion and thickening. 3. Normal ejection fraction estimated at 77%.   Electronically Signed   By: Jeronimo Greaves M.D.   On: 08/28/2013 12:15    Dg Chest Port 1 View   08/27/2013   CLINICAL DATA:  53 year old female with chest pain and cough.  EXAM: PORTABLE CHEST - 1 VIEW  COMPARISON:  None.  FINDINGS: The cardiomediastinal silhouette is unremarkable.  The lungs are clear.  There is no evidence of focal airspace disease, pulmonary edema, suspicious pulmonary nodule/mass, pleural effusion, or pneumothorax. No acute bony abnormalities are identified.  IMPRESSION: No active disease.   Electronically Signed   By: Laveda Abbe M.D.   On: 08/27/2013 18:07      Microbiology: No results found for this or any previous visit (from the past 240 hour(s)).    Labs: Basic Metabolic Panel: Recent Labs Lab  08/27/13 1748   NA  142   K  4.3   CL  105   CO2  25   GLUCOSE  90   BUN  14   CREATININE  0.76   CALCIUM  8.3*    Liver Function Tests: No results found for this basename: AST, ALT, ALKPHOS, BILITOT, PROT, ALBUMIN,  in the last 168 hours No results found for this basename: LIPASE, AMYLASE,  in the last 168 hours No results found for this basename: AMMONIA,  in the last 168 hours CBC: Recent Labs Lab  08/27/13 1748   WBC  4.0   HGB  14.6   HCT  42.0   MCV  89.6   PLT  207    Cardiac Enzymes: Recent Labs Lab  08/27/13 1848  08/28/13 0050   TROPONINI  <0.30  <0.30    BNP: BNP (last 3 results) No results found for this basename: PROBNP,  in the last 8760 hours CBG: No results found for this basename: GLUCAP,  in the last 168 hours         Signed:   Penny Pia       [facility] 08/28/2013, 3:19 PM

## 2013-08-29 NOTE — Care Management (Signed)
[phone] 08-29-13 CM did receive a referral for PCP. Pt was d/c the day before. CM did place phone call to pt to make her aware that she could call the Number on her insurance card and they will get her set up with PCP. No further needs from CM at this time. Gala LewandowskyGraves-Bigelow, Ojani Berenson Kaye, RN,BSN [phone]

## 2013-08-31 ENCOUNTER — Telehealth: Payer: Self-pay | Admitting: *Deleted

## 2013-08-31 NOTE — Telephone Encounter (Signed)
[name], [name] - 08/27/13 More Detail >>      [name], MD      Sent: Wynelle Link August 28, 2013  1:45 PM      To: Alois Cliche, LPN              Message      F/U with me [facility] post hospital next available chest pain                 LEFT MESSAGE TO CALL BACK RE NEEDING  NEXT AVAILABLE  APPT WITH  DR Eden [facility] FOR  CHEST PAIN          Results   NM Myocar Multi W/Spect W/Wall Motion / EF (Order [id number])        NM Myocar Multi W/Spect W/Wall Motion / EF  Status: Final result     Visible to patient: This result is not viewable by the patient.     Next appt: 12/21/2013 at 11:15 AM in Gynecology [name], CNM)            Details      Reading Physician Reading Date Result Priority      Consuello Bossier, MD 08/28/2013           Narrative          CLINICAL DATA:  Chest pain.  EXAM: MYOCARDIAL IMAGING WITH SPECT (REST AND EXERCISE)  GATED LEFT VENTRICULAR WALL MOTION STUDY  LEFT VENTRICULAR EJECTION FRACTION  TECHNIQUE: Standard myocardial SPECT imaging was performed after resting intravenous injection of 10 mCi Tc-42m sestamibi. Subsequently, exercise tolerance test was performed by the patient under the supervision of the Cardiology staff. At peak-stress, 30 mCi Tc-47m sestamibi was injected intravenously and standard myocardial SPECT imaging was performed. Quantitative gated imaging was also performed to evaluate left ventricular wall motion, and estimate left ventricular ejection fraction.  COMPARISON:  Plain film 08/27/2013.  FINDINGS: Rest images demonstrate normal left ventricular cavity size. No fixed defect. Stress images demonstrate an area of subtle reversibility involving the apical segment of the anterior wall.  Evaluation of wall motion demonstrates normal left ventricular wall motion and thickening.  Ejection fraction is estimated at 77%. End diastolic volume of 50 cc. End systolic volume of 12 cc.  IMPRESSION: 1.  Subtle area of reversibility involving the apical segment of the anterior wall. Cannot exclude inducible ischemia. 2. Normal left ventricular wall motion and thickening. 3. Normal ejection fraction estimated at 77%.   Electronically Signed   By: Jeronimo Greaves M.D.   On: 08/28/2013 12:15          Last Resulted: 08/28/13 12:15 PM                       Order Questions      Question Answer Comment      Can the patient sign their own consent? Yes        Note:  If patint cannot sign, will family member be signing?      Is the patient NPO Yes        Reason for Exam (SYMPTOM  OR DIAGNOSIS REQUIRED) chest pain resolvd with nitro,        Note:  Appropriate reason for exam on pre-op X-rays should indicate the reason for the surgery. Examples include: Coronary Artery disease, osteoartiritis of the knee, brain tumor, etc.  Encounter      View Encounter            Result Information      Status      Final result (08/28/2013 12:15 PM)      Provider Status: Open               Lab Information      [facility]                        PACS Images      Show images for NM Myocar Multi W/Spect W/Wall Motion / EF          Signed      Electronically signed by Consuello Bossier, MD on 08/28/13 at 1215 EST         Order-Level Documents:      There are no order-level documents.           NM Myocar Multi W/Spect W/Wall Motion / EF (Order [id number])  Imaging  Order: [id number]   Released By: Automatic Release User  Authorizing: Huey Bienenstock, MD   Date: 08/28/2013  Department: [facility]  [address]               [place], [place]  NPI: [id number]               Order Information      Order Date/Time Release Date/Time Start Date/Time End Date/Time      08/27/13 08:38 PM 08/27/13 08:38 PM 08/28/13 09:01 AM 08/28/13 09:01 AM              Order Details      Frequency Duration Priority Order Class      1 time imaging 1  occurrence  Routine Hospital Performed              Comments      Approved By Dr Zachery Conch            Order Questions      Question Answer Comment      Can the patient sign their own consent? Yes        Note:  If patint cannot sign, will family member be signing?      Is the patient NPO Yes        Reason for Exam (SYMPTOM  OR DIAGNOSIS REQUIRED) chest pain resolvd with nitro,        Note:  Appropriate reason for exam on pre-op X-rays should indicate the reason for the surgery. Examples include: Coronary Artery disease, osteoartiritis of the knee, brain tumor, etc.              Process Instructions      For questions about ordering imaging exams in [facility], please call the helpline [phone].     If consultation on the most appropriate imaging exam is necessary from a Radiology physician, please call [phone].               Order History Inpatient      Date/Time Action Taken User Additional Information       0000 Result Amber Ottis Stain In process       0000 Result Amber Ottis Stain In process      08/28/13 0900 Release Amber Ottis Stain From Order: [id number]      08/28/13 1215 Result Rad Results In Interface Final  Appointments for this Order      08/28/2013 10:50 AM  - 30 min [facility] Stress 1 (Resource) [facility]      08/28/2013  9:20 AM  - 30 min Mc-Nm 1 (Resource) [facility]      08/28/2013  8:20 AM  - 30 min [facility] Inj 1 (Resource) [facility]               Collection Information      Resulting Agency: [facility]                Acknowledgement Info      For At Acknowledged By Acknowledged On      Placing Order 08/27/13 2038 Annabell SabalKristen Smith McCall, RN 08/28/13 [phone]                    Verbal Order Info      Action Created on Order Mode Communicator Responsible Provider Signed by Signed on      Ordering 08/28/13 0900 PER PROTOCOL: COSIGN REQUIRED Amber Suzanne BoronNicole Pitts Dawood Elgergawy, MD                Cosign Order Info      Action  Created on Responsible Provider Signed by Signed on      Ordering 08/28/13 0900 Huey Bienenstockawood Elgergawy, MD                    Patient Information      Patient Name Sex DOB SSN      Rebekah Hale, Rebekah Hale Female 03/13/1961 [id number]              Additional Information      Associated Reports      View Parent Encounter      Priority and Order Details               Order Provider Info          Office phone Pager/beeper E-mail      Ordering User Amber Ottis Stainicole Pitts [phone] -- [email or web address]      Authorizing Provider Huey Bienenstockawood Elgergawy, MD [phone] -- --      Billing Provider Consuello BossierKyle D Talbot, MD [phone] --                Encounter      View Encounter             Order-Level Documents:      There are no order-level documents.

## 2013-09-20 NOTE — Telephone Encounter (Signed)
lmtcb ./cy 

## 2013-09-26 NOTE — Telephone Encounter (Signed)
SPOKE WITH  PT  FEELS  FINE   REFUSES  TO  MAKE  FOLLOW UP  APPT  AT THIS  TIME .Rebekah Hale/CY

## 2013-11-15 ENCOUNTER — Telehealth: Payer: Self-pay

## 2013-11-15 NOTE — Telephone Encounter (Signed)
Message copied by Eliezer BottomJOHNSON, DAVINA J on Tue Nov 15, 2013  2:35 PM ------      Message from: Ria CommentGRUBB, PATRICIA R      Created: Fri Nov 11, 2013 12:54 PM       Please put in 2 month recall and if no response please call      ----- Message -----         From: SYSTEM         Sent: 06/26/2013  12:09 AM           To: Verner Choleborah S Leonard, CNM                   ------

## 2013-11-15 NOTE — Telephone Encounter (Signed)
Called pt to remind her to turn in her ifob. Pt states she was never given one at her appt. Pt assures me one was never given. Not sure if patient was charged for this. Routed to D.Leonard,cnm Close encounter when done

## 2013-12-21 ENCOUNTER — Ambulatory Visit: Payer: BC Managed Care – PPO | Admitting: Certified Nurse Midwife

## 2013-12-29 ENCOUNTER — Encounter: Payer: Self-pay | Admitting: Certified Nurse Midwife

## 2013-12-29 ENCOUNTER — Ambulatory Visit (INDEPENDENT_AMBULATORY_CARE_PROVIDER_SITE_OTHER): Payer: BC Managed Care – PPO | Admitting: Certified Nurse Midwife

## 2013-12-29 VITALS — BP 102/70 | HR 68 | Resp 16 | Ht 60.5 in | Wt 150.0 lb

## 2013-12-29 DIAGNOSIS — F341 Dysthymic disorder: Secondary | ICD-10-CM

## 2013-12-29 DIAGNOSIS — F329 Major depressive disorder, single episode, unspecified: Secondary | ICD-10-CM

## 2013-12-29 DIAGNOSIS — F32A Depression, unspecified: Secondary | ICD-10-CM

## 2013-12-29 DIAGNOSIS — F419 Anxiety disorder, unspecified: Principal | ICD-10-CM

## 2013-12-29 MED ORDER — ESCITALOPRAM OXALATE 10 MG PO TABS: 10.0000 mg | ORAL_TABLET | Freq: Every day | ORAL | Status: AC

## 2013-12-29 NOTE — Patient Instructions (Signed)

## 2013-12-29 NOTE — Progress Notes (Signed)
52 y.o.[id number] for follow-up of anxiety being treated with  Lexapro 10mg ..   Initiated 06/03/12.  Patient taking medication as instructed AM.  Denies nausea, headache or other medication side effects . Reports crying but mother passed 2 weeks ago. Patient had cared for her mom in her home. Emotionally feeling OK, was with mother when she died. No panic attacks, some insomnia feels it is related to emotional feelings with her mothers death..Patient will moving out of the area to obtain a new job. 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  O:Healthy WD,WN female, appropriately dressed    Weight:150 Affect : Appropriate    A:1-Anxiety responding to Lexapro well Recent social stress with mother's death, responding appropriately  P:1- Continue medication as prescribed 2-Rx Lexapro see order take before going to bed Encourage hospice grief counseling as needed Take time for self during her move and employment seeking.  3-RV aex, prn 4-Instructed if thoughts of self harm or others seek immediate help 911 or emergency room.  Questions addressed.     35 minutes spent with patient with  in face to face counseling.

## 2013-12-31 NOTE — Progress Notes (Signed)
Reviewed personally.  M. Suzanne Samarion Ehle, MD.  

## 2014-05-22 ENCOUNTER — Encounter: Payer: Self-pay | Admitting: Certified Nurse Midwife

## 2014-06-27 ENCOUNTER — Ambulatory Visit: Payer: BC Managed Care – PPO | Admitting: Certified Nurse Midwife
